# Patient Record
Sex: Female | Born: 1995 | Race: White | Hispanic: No | Marital: Single | Smoking: Never smoker
Health system: Southern US, Community
[De-identification: ages and names within clinical notes are randomized; demographics above are authoritative.]

## PROBLEM LIST (undated history)

## (undated) DIAGNOSIS — F4010 Social phobia, unspecified: Secondary | ICD-10-CM

## (undated) HISTORY — DX: Social phobia, unspecified: F40.10

---
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
## 2021-12-09 ENCOUNTER — Ambulatory Visit: Payer: Self-pay | Admitting: Family

## 2022-08-10 ENCOUNTER — Ambulatory Visit: Payer: Medicaid Other | Admitting: Internal Medicine

## 2022-08-10 ENCOUNTER — Encounter: Payer: Self-pay | Admitting: Internal Medicine

## 2022-08-10 VITALS — BP 112/80 | HR 100 | Ht 62.0 in | Wt 114.0 lb

## 2022-08-10 DIAGNOSIS — F411 Generalized anxiety disorder: Secondary | ICD-10-CM | POA: Diagnosis not present

## 2022-08-10 DIAGNOSIS — N83209 Unspecified ovarian cyst, unspecified side: Secondary | ICD-10-CM | POA: Diagnosis not present

## 2022-08-10 DIAGNOSIS — N3001 Acute cystitis with hematuria: Secondary | ICD-10-CM | POA: Insufficient documentation

## 2022-08-10 DIAGNOSIS — R102 Pelvic and perineal pain: Secondary | ICD-10-CM | POA: Diagnosis not present

## 2022-08-10 LAB — POCT URINALYSIS DIPSTICK
Bilirubin, UA: NEGATIVE
Glucose, UA: NEGATIVE
Ketones, UA: NEGATIVE
Nitrite, UA: NEGATIVE
Protein, UA: NEGATIVE
Spec Grav, UA: 1.01 (ref 1.010–1.025)
Urobilinogen, UA: 0.2 E.U./dL
pH, UA: 5.5 (ref 5.0–8.0)

## 2022-08-10 MED ORDER — ESCITALOPRAM OXALATE 5 MG PO TABS: 5.0000 mg | ORAL_TABLET | Freq: Every day | ORAL | 6 refills | Status: DC

## 2022-08-10 NOTE — Progress Notes (Signed)
New Patient Office Visit  Subjective    Patient ID: Tara Daniel, female    DOB: [birth year]  Age: 27 y.o. MRN: [id number]  CC:  Chief Complaint  Patient presents with   Annual Exam    New Patient    HPI Tara Daniel presents to establish care Previous Primary Care provider/office:   she does have additional concerns to discuss today.   Patient comes in to establish PMD.  She has recently moved here from [place].  Currently she is attending some classes at the college.  Patient comes in with complaints of left-sided lower pelvic pain.  She reports that in the past she was told she has an ovarian cyst because she had similar complaints.  Back then the pain had resolved spontaneously.  Patient reports of regular menstrual cycle which occurs every 28 days with the first 2 days being heavy.  She does not have any nausea or vomiting, no diarrhea no constipation.  No urinary complaints.  Her urine dipstick was done in the office which only showed some trace leukocytes. Patient has history of  social anxiety.  Her current PHQ-GAD score is 19/12.  She reports that she has been diagnosed with anxiety but she has not taken any medication or talked to a Psychologist in the past.  Today she agrees to start a small dose of Lexapro and will consider psychotherapy. Patient is not sexually active and she has never had a Pap smear before.    Outpatient Encounter Medications as of 08/10/2022  Medication Sig   escitalopram (LEXAPRO) 5 MG tablet Take 1 tablet (5 mg total) by mouth daily.   No facility-administered encounter medications on file as of 08/10/2022.    Past Medical History:  Diagnosis Date   Social anxiety disorder     History reviewed. No pertinent surgical history.  Family History  Problem Relation Age of Onset   Healthy Mother    Healthy Father     Social History   Socioeconomic History   Marital status: Single    Spouse name: Not on file   Number of children: Not on file    Years of education: Not on file   Highest education level: Not on file  Occupational History   Not on file  Tobacco Use   Smoking status: Never   Smokeless tobacco: Never  Substance and Sexual Activity   Alcohol use: Never   Drug use: Never   Sexual activity: Never  Other Topics Concern   Not on file  Social History Narrative   Not on file   Social Determinants of Health   Financial Resource Strain: Not on file  Food Insecurity: Not on file  Transportation Needs: Not on file  Physical Activity: Not on file  Stress: Not on file  Social Connections: Not on file  Intimate Partner Violence: Not on file    Review of Systems  Constitutional:  Negative for chills, diaphoresis, fever, malaise/fatigue and weight loss.  HENT: Negative.    Eyes: Negative.   Respiratory:  Negative for cough, shortness of breath and wheezing.   Cardiovascular:  Negative for chest pain, palpitations, orthopnea, claudication, leg swelling and PND.  Gastrointestinal:  Positive for abdominal pain. Negative for blood in stool, constipation, diarrhea, heartburn, melena, nausea and vomiting.  Genitourinary:  Negative for dysuria, flank pain, frequency, hematuria and urgency.  Musculoskeletal:  Negative for back pain, falls, joint pain, myalgias and neck pain.  Skin: Negative.   Neurological:  Negative for dizziness, tremors, seizures  and loss of consciousness.  Psychiatric/Behavioral:  Positive for depression. Negative for hallucinations, substance abuse and suicidal ideas. The patient is nervous/anxious. The patient does not have insomnia.         Objective    BP 112/80   Pulse 100   Ht 5\' 2"  (1.575 m)   Wt 114 lb (51.7 kg)   SpO2 99%   BMI 20.85 kg/m   Physical Exam Vitals and nursing note reviewed.  Constitutional:      General: She is not in acute distress.    Appearance: Normal appearance. She is not ill-appearing.  HENT:     Head: Normocephalic and atraumatic.     Right Ear: Tympanic  membrane normal.     Left Ear: Tympanic membrane normal.     Nose: Nose normal. No congestion.     Mouth/Throat:     Mouth: Mucous membranes are moist.     Pharynx: Oropharynx is clear. No oropharyngeal exudate or posterior oropharyngeal erythema.  Eyes:     Pupils: Pupils are equal, round, and reactive to light.  Cardiovascular:     Rate and Rhythm: Normal rate and regular rhythm.     Pulses: Normal pulses.     Heart sounds: Normal heart sounds. No murmur heard. Pulmonary:     Effort: Pulmonary effort is normal.     Breath sounds: Normal breath sounds. No wheezing, rhonchi or rales.  Abdominal:     General: Bowel sounds are normal. There is no distension.     Palpations: Abdomen is soft. There is no mass.     Tenderness: There is abdominal tenderness (left lower abdominal area.). There is no right CVA tenderness, left CVA tenderness, guarding or rebound.     Hernia: No hernia is present.  Musculoskeletal:        General: No swelling, tenderness, deformity or signs of injury. Normal range of motion.     Cervical back: Normal range of motion. No rigidity.     Right lower leg: No edema.     Left lower leg: No edema.  Skin:    General: Skin is warm and dry.  Neurological:     General: No focal deficit present.     Mental Status: She is alert and oriented to person, place, and time.  Psychiatric:        Mood and Affect: Mood normal.        Behavior: Behavior normal.        Assessment & Plan:  Check pelvic ultrasound.  Blood work.  Start Lexapro tablets. Bellah was seen today for annual exam.  Diagnoses and all orders for this visit:  Pelvic pain -     US PELVIS (TRANSABDOMINAL ONLY); Future -     CBC With Differential -     CMP14+EGFR  Cyst of ovary, unspecified laterality -     POCT Urinalysis Dipstick (78295)  GAD (generalized anxiety disorder) -     escitalopram (LEXAPRO) 5 MG tablet; Take 1 tablet (5 mg total) by mouth daily.      Return in about 2 weeks  (around 08/24/2022).   Total time spent: 40 minutes  Margaretann Loveless, MD  08/10/2022   This document may have been prepared by [facility] Voice Recognition software and as such may include unintentional dictation errors.

## 2022-08-11 LAB — CMP14+EGFR
ALT: 7 IU/L (ref 0–32)
AST: 15 IU/L (ref 0–40)
Albumin/Globulin Ratio: 2.1 (ref 1.2–2.2)
Albumin: 4.8 g/dL (ref 4.0–5.0)
Alkaline Phosphatase: 47 IU/L (ref 44–121)
BUN/Creatinine Ratio: 18 (ref 9–23)
BUN: 13 mg/dL (ref 6–20)
Bilirubin Total: 0.6 mg/dL (ref 0.0–1.2)
CO2: 21 mmol/L (ref 20–29)
Calcium: 9.5 mg/dL (ref 8.7–10.2)
Chloride: 103 mmol/L (ref 96–106)
Creatinine, Ser: 0.71 mg/dL (ref 0.57–1.00)
Globulin, Total: 2.3 g/dL (ref 1.5–4.5)
Glucose: 75 mg/dL (ref 70–99)
Potassium: 3.7 mmol/L (ref 3.5–5.2)
Sodium: 140 mmol/L (ref 134–144)
Total Protein: 7.1 g/dL (ref 6.0–8.5)
eGFR: 119 mL/min/{1.73_m2} (ref >59)

## 2022-08-11 LAB — CBC WITH DIFFERENTIAL
Basophils Absolute: 0 10*3/uL (ref 0.0–0.2)
Basos: 1 %
EOS (ABSOLUTE): 0.1 10*3/uL (ref 0.0–0.4)
Eos: 2 %
Hematocrit: 43.6 % (ref 34.0–46.6)
Hemoglobin: 14.6 g/dL (ref 11.1–15.9)
Immature Grans (Abs): 0 10*3/uL (ref 0.0–0.1)
Immature Granulocytes: 0 %
Lymphocytes Absolute: 1.4 10*3/uL (ref 0.7–3.1)
Lymphs: 28 %
MCH: 31.6 pg (ref 26.6–33.0)
MCHC: 33.5 g/dL (ref 31.5–35.7)
MCV: 94 fL (ref 79–97)
Monocytes Absolute: 0.4 10*3/uL (ref 0.1–0.9)
Monocytes: 8 %
Neutrophils Absolute: 3 10*3/uL (ref 1.4–7.0)
Neutrophils: 61 %
RBC: 4.62 x10E6/uL (ref 3.77–5.28)
RDW: 12.3 % (ref 11.7–15.4)
WBC: 4.8 10*3/uL (ref 3.4–10.8)

## 2022-08-15 ENCOUNTER — Ambulatory Visit
Admission: RE | Admit: 2022-08-15 | Discharge: 2022-08-15 | Disposition: A | Discharge status: Home / Self Care | Payer: Medicaid Other | Source: Ambulatory Visit | Attending: Internal Medicine | Admitting: Internal Medicine

## 2022-08-15 ENCOUNTER — Other Ambulatory Visit: Payer: Self-pay | Admitting: Internal Medicine

## 2022-08-15 DIAGNOSIS — R102 Pelvic and perineal pain: Secondary | ICD-10-CM

## 2022-08-15 DIAGNOSIS — N83209 Unspecified ovarian cyst, unspecified side: Secondary | ICD-10-CM

## 2022-08-15 DIAGNOSIS — F411 Generalized anxiety disorder: Secondary | ICD-10-CM

## 2022-08-24 ENCOUNTER — Ambulatory Visit: Payer: Medicaid Other | Admitting: Internal Medicine

## 2022-08-24 ENCOUNTER — Encounter: Payer: Self-pay | Admitting: Internal Medicine

## 2022-08-24 VITALS — BP 118/64 | HR 88 | Ht 62.0 in | Wt 108.0 lb

## 2022-08-24 DIAGNOSIS — F411 Generalized anxiety disorder: Secondary | ICD-10-CM

## 2022-08-24 DIAGNOSIS — Z808 Family history of malignant neoplasm of other organs or systems: Secondary | ICD-10-CM

## 2022-08-24 MED ORDER — ESCITALOPRAM OXALATE 10 MG PO TABS: 10.0000 mg | ORAL_TABLET | Freq: Every day | ORAL | 2 refills | Status: AC

## 2022-08-24 NOTE — Progress Notes (Signed)
Established Patient Office Visit  Subjective:  Patient ID: Tara Daniel, female    DOB: 02/06/1996  Age: 27 y.o. MRN: [id number]  Chief Complaint  Patient presents with   Follow-up    2 week follow up    Patient comes in for follow-up of her left pelvic pain and anxiety.  Patient had presented with left-sided pelvic pain which has happened before.  According to her records in 2021 she had gone to the emergency room in [place] and was found to have a cyst on the left ovary, which was considered to be an involutional cyst.  The repeat pelvic ultrasound done now shows no cyst in either one of the ovaries.  And her symptoms have also resolved as well. Her labs were discussed which are also essentially unremarkable. Patient continues to look anxious.  She admits that she has started taking Lexapro 5 mg/day but does not think it has made any difference.  She agrees to increase the dose to 10 mg/day.  She will continue to think about talking to a counselor.  She has started her studies which is contributing to her anxiety. Patient also requests a dermatology consult since she has a family history of skin cancer, not sure which one.  Will set up a dermatology consult.    No other concerns at this time.   Past Medical History:  Diagnosis Date   Social anxiety disorder     History reviewed. No pertinent surgical history.  Social History   Socioeconomic History   Marital status: Single    Spouse name: Not on file   Number of children: Not on file   Years of education: Not on file   Highest education level: Not on file  Occupational History   Not on file  Tobacco Use   Smoking status: Never   Smokeless tobacco: Never  Substance and Sexual Activity   Alcohol use: Never   Drug use: Never   Sexual activity: Never  Other Topics Concern   Not on file  Social History Narrative   Not on file   Social Determinants of Health   Financial Resource Strain: Not on file  Food Insecurity: Not  on file  Transportation Needs: Not on file  Physical Activity: Not on file  Stress: Not on file  Social Connections: Not on file  Intimate Partner Violence: Not on file    Family History  Problem Relation Age of Onset   Healthy Mother    Healthy Father     Not on File  Review of Systems  Constitutional:  Positive for malaise/fatigue. Negative for chills, diaphoresis, fever and weight loss.  HENT: Negative.    Eyes: Negative.   Respiratory:  Negative for cough, shortness of breath and wheezing.   Cardiovascular:  Negative for chest pain, palpitations, leg swelling and PND.  Gastrointestinal:  Negative for abdominal pain, blood in stool, heartburn, melena, nausea and vomiting.  Genitourinary:  Negative for dysuria, frequency, hematuria and urgency.  Musculoskeletal:  Negative for falls, joint pain and myalgias.  Neurological:  Negative for dizziness, seizures, weakness and headaches.  Psychiatric/Behavioral:  Negative for depression, substance abuse and suicidal ideas. The patient is nervous/anxious. The patient does not have insomnia.        Objective:   BP 118/64   Pulse 88   Ht 5\' 2"  (1.575 m)   Wt 108 lb (49 kg)   SpO2 98%   BMI 19.75 kg/m   Vitals:   08/24/22 1448  BP: 118/64  Pulse: 88  Height: 5\' 2"  (1.575 m)  Weight: 108 lb (49 kg)  SpO2: 98%  BMI (Calculated): 19.75    Physical Exam Vitals and nursing note reviewed.  Constitutional:      General: She is not in acute distress.    Appearance: Normal appearance.  HENT:     Head: Normocephalic and atraumatic.  Neck:     Vascular: No carotid bruit.  Cardiovascular:     Rate and Rhythm: Normal rate and regular rhythm.     Pulses: Normal pulses.     Heart sounds: Normal heart sounds.  Pulmonary:     Effort: Pulmonary effort is normal.     Breath sounds: Normal breath sounds. No rales.  Chest:     Chest wall: No tenderness.  Abdominal:     General: Bowel sounds are normal.     Palpations: Abdomen  is soft.  Musculoskeletal:        General: Normal range of motion.     Cervical back: Normal range of motion and neck supple.     Right lower leg: No edema.     Left lower leg: No edema.  Lymphadenopathy:     Cervical: No cervical adenopathy.  Skin:    General: Skin is warm and dry.     Findings: No lesion or rash.  Neurological:     General: No focal deficit present.     Mental Status: She is alert and oriented to person, place, and time.  Psychiatric:        Mood and Affect: Mood normal.        Behavior: Behavior normal.      No results found for any visits on 08/24/22.  Recent Results (from the past 2160 hour(s))  POCT Urinalysis Dipstick (16109)     Status: Abnormal   Collection Time: 08/10/22  2:29 PM  Result Value Ref Range   Color, UA clear    Clarity, UA clear    Glucose, UA Negative Negative   Bilirubin, UA negative    Ketones, UA negative    Spec Grav, UA 1.010 1.010 - 1.025   Blood, UA trace    pH, UA 5.5 5.0 - 8.0   Protein, UA Negative Negative   Urobilinogen, UA 0.2 0.2 or 1.0 E.U./dL   Nitrite, UA negative    Leukocytes, UA Trace (A) Negative   Appearance clear    Odor none   CBC With Differential     Status: None   Collection Time: 08/10/22  2:55 PM  Result Value Ref Range   WBC 4.8 3.4 - 10.8 x10E3/uL   RBC 4.62 3.77 - 5.28 x10E6/uL   Hemoglobin 14.6 11.1 - 15.9 g/dL   Hematocrit 60.4 54.0 - 46.6 %   MCV 94 79 - 97 fL   MCH 31.6 26.6 - 33.0 pg   MCHC 33.5 31.5 - 35.7 g/dL   RDW 98.1 19.1 - 47.8 %   Neutrophils 61 Not Estab. %   Lymphs 28 Not Estab. %   Monocytes 8 Not Estab. %   Eos 2 Not Estab. %   Basos 1 Not Estab. %   Neutrophils Absolute 3.0 1.4 - 7.0 x10E3/uL   Lymphocytes Absolute 1.4 0.7 - 3.1 x10E3/uL   Monocytes Absolute 0.4 0.1 - 0.9 x10E3/uL   EOS (ABSOLUTE) 0.1 0.0 - 0.4 x10E3/uL   Basophils Absolute 0.0 0.0 - 0.2 x10E3/uL   Immature Granulocytes 0 Not Estab. %   Immature Grans (Abs) 0.0 0.0 - 0.1 x10E3/uL  CMP14+EGFR      Status: None   Collection Time: 08/10/22  2:55 PM  Result Value Ref Range   Glucose 75 70 - 99 mg/dL   BUN 13 6 - 20 mg/dL   Creatinine, Ser 7.42 0.57 - 1.00 mg/dL   eGFR 595 >63 OV/FIE/3.32   BUN/Creatinine Ratio 18 9 - 23   Sodium 140 134 - 144 mmol/L   Potassium 3.7 3.5 - 5.2 mmol/L   Chloride 103 96 - 106 mmol/L   CO2 21 20 - 29 mmol/L   Calcium 9.5 8.7 - 10.2 mg/dL   Total Protein 7.1 6.0 - 8.5 g/dL   Albumin 4.8 4.0 - 5.0 g/dL   Globulin, Total 2.3 1.5 - 4.5 g/dL   Albumin/Globulin Ratio 2.1 1.2 - 2.2   Bilirubin Total 0.6 0.0 - 1.2 mg/dL   Alkaline Phosphatase 47 44 - 121 IU/L   AST 15 0 - 40 IU/L   ALT 7 0 - 32 IU/L      Assessment & Plan:   Problem List Items Addressed This Visit     GAD (generalized anxiety disorder) - Primary   Relevant Medications   escitalopram (LEXAPRO) 10 MG tablet   Family history of skin cancer   Relevant Orders   Ambulatory referral to Dermatology    Return in about 6 weeks (around 10/05/2022).   Total time spent: 25 minutes  Margaretann Loveless, MD  08/24/2022   This document may have been prepared by [facility] Voice Recognition software and as such may include unintentional dictation errors.

## 2022-09-15 ENCOUNTER — Ambulatory Visit: Payer: Medicaid Other | Admitting: Physician Assistant

## 2022-10-05 ENCOUNTER — Ambulatory Visit: Payer: Medicaid Other | Admitting: Internal Medicine
